# Patient Record
Sex: Female | Born: 1960 | Race: Black or African American | Hispanic: No | Marital: Married | Smoking: Never smoker
Health system: Southern US, Community
[De-identification: ages and names within clinical notes are randomized; demographics above are authoritative.]

## PROBLEM LIST (undated history)

## (undated) DIAGNOSIS — I10 Essential (primary) hypertension: Secondary | ICD-10-CM

## (undated) DIAGNOSIS — K5090 Crohn's disease, unspecified, without complications: Secondary | ICD-10-CM

## (undated) DIAGNOSIS — E785 Hyperlipidemia, unspecified: Secondary | ICD-10-CM

---

## 1988-11-29 HISTORY — PX: SMALL INTESTINE SURGERY: SHX150

## 2005-05-17 ENCOUNTER — Other Ambulatory Visit: Admission: RE | Admit: 2005-05-17 | Discharge: 2005-05-17 | Payer: Self-pay | Admitting: Obstetrics and Gynecology

## 2005-06-23 ENCOUNTER — Other Ambulatory Visit: Admission: RE | Admit: 2005-06-23 | Discharge: 2005-06-23 | Payer: Self-pay | Admitting: Obstetrics and Gynecology

## 2005-06-28 ENCOUNTER — Ambulatory Visit: Payer: Self-pay | Admitting: Family Medicine

## 2007-11-24 ENCOUNTER — Ambulatory Visit: Payer: Self-pay | Admitting: Family Medicine

## 2009-11-11 ENCOUNTER — Ambulatory Visit: Payer: Self-pay | Admitting: Family Medicine

## 2010-10-09 ENCOUNTER — Ambulatory Visit: Payer: Self-pay | Admitting: Gastroenterology

## 2010-10-13 LAB — PATHOLOGY REPORT

## 2010-11-12 ENCOUNTER — Ambulatory Visit: Payer: Self-pay | Admitting: Unknown Physician Specialty

## 2011-09-03 ENCOUNTER — Ambulatory Visit: Payer: Self-pay | Admitting: Unknown Physician Specialty

## 2011-12-27 ENCOUNTER — Ambulatory Visit: Payer: Self-pay | Admitting: Unknown Physician Specialty

## 2013-05-17 ENCOUNTER — Ambulatory Visit: Payer: Self-pay | Admitting: Physician Assistant

## 2013-06-06 ENCOUNTER — Ambulatory Visit: Payer: Self-pay | Admitting: Physician Assistant

## 2014-07-12 ENCOUNTER — Ambulatory Visit: Payer: Self-pay | Admitting: Physician Assistant

## 2015-09-02 ENCOUNTER — Other Ambulatory Visit: Payer: Self-pay | Admitting: Physician Assistant

## 2015-09-02 DIAGNOSIS — Z1231 Encounter for screening mammogram for malignant neoplasm of breast: Secondary | ICD-10-CM

## 2015-09-09 ENCOUNTER — Ambulatory Visit: Payer: Self-pay

## 2015-09-15 ENCOUNTER — Ambulatory Visit
Admission: RE | Admit: 2015-09-15 | Discharge: 2015-09-15 | Disposition: A | Discharge status: Home / Self Care | Payer: Managed Care, Other (non HMO) | Source: Ambulatory Visit | Attending: Physician Assistant | Admitting: Physician Assistant

## 2015-09-15 DIAGNOSIS — Z1231 Encounter for screening mammogram for malignant neoplasm of breast: Secondary | ICD-10-CM | POA: Diagnosis present

## 2017-06-13 ENCOUNTER — Other Ambulatory Visit: Payer: Self-pay | Admitting: Internal Medicine

## 2017-06-13 DIAGNOSIS — Z1231 Encounter for screening mammogram for malignant neoplasm of breast: Secondary | ICD-10-CM

## 2017-07-08 ENCOUNTER — Ambulatory Visit
Admission: RE | Admit: 2017-07-08 | Discharge: 2017-07-08 | Disposition: A | Discharge status: Home / Self Care | Payer: Managed Care, Other (non HMO) | Source: Ambulatory Visit | Attending: Internal Medicine | Admitting: Internal Medicine

## 2017-07-08 DIAGNOSIS — Z1231 Encounter for screening mammogram for malignant neoplasm of breast: Secondary | ICD-10-CM | POA: Diagnosis present

## 2018-06-19 ENCOUNTER — Other Ambulatory Visit: Payer: Self-pay | Admitting: Physician Assistant

## 2018-06-19 DIAGNOSIS — Z1231 Encounter for screening mammogram for malignant neoplasm of breast: Secondary | ICD-10-CM

## 2020-05-12 ENCOUNTER — Ambulatory Visit
Admission: RE | Admit: 2020-05-12 | Discharge: 2020-05-12 | Disposition: A | Discharge status: Home / Self Care | Payer: Managed Care, Other (non HMO) | Source: Ambulatory Visit | Attending: Physician Assistant | Admitting: Physician Assistant

## 2020-05-12 ENCOUNTER — Other Ambulatory Visit: Payer: Self-pay | Admitting: Physician Assistant

## 2020-05-12 DIAGNOSIS — Z1231 Encounter for screening mammogram for malignant neoplasm of breast: Secondary | ICD-10-CM

## 2021-06-26 ENCOUNTER — Other Ambulatory Visit: Payer: Self-pay | Admitting: Physician Assistant

## 2021-06-26 DIAGNOSIS — Z1231 Encounter for screening mammogram for malignant neoplasm of breast: Secondary | ICD-10-CM

## 2021-07-03 ENCOUNTER — Other Ambulatory Visit: Payer: Self-pay

## 2021-07-03 ENCOUNTER — Ambulatory Visit
Admission: RE | Admit: 2021-07-03 | Discharge: 2021-07-03 | Disposition: A | Discharge status: Home / Self Care | Payer: 59 | Source: Ambulatory Visit | Attending: Physician Assistant | Admitting: Physician Assistant

## 2021-07-03 DIAGNOSIS — Z1231 Encounter for screening mammogram for malignant neoplasm of breast: Secondary | ICD-10-CM | POA: Diagnosis not present

## 2022-05-14 IMAGING — MG MM DIGITAL SCREENING BILAT W/ TOMO AND CAD
8 series · 8 of 24 positions shown · non-contrast
Comparison: Previous exam(s).

CLINICAL DATA: Screening.

EXAM:
DIGITAL SCREENING BILATERAL MAMMOGRAM WITH TOMOSYNTHESIS AND CAD
TECHNIQUE: Bilateral screening digital craniocaudal and mediolateral oblique
mammograms were obtained. Bilateral screening digital breast
tomosynthesis was performed. The images were evaluated with
computer-aided detection.

[L CC synth-2D]
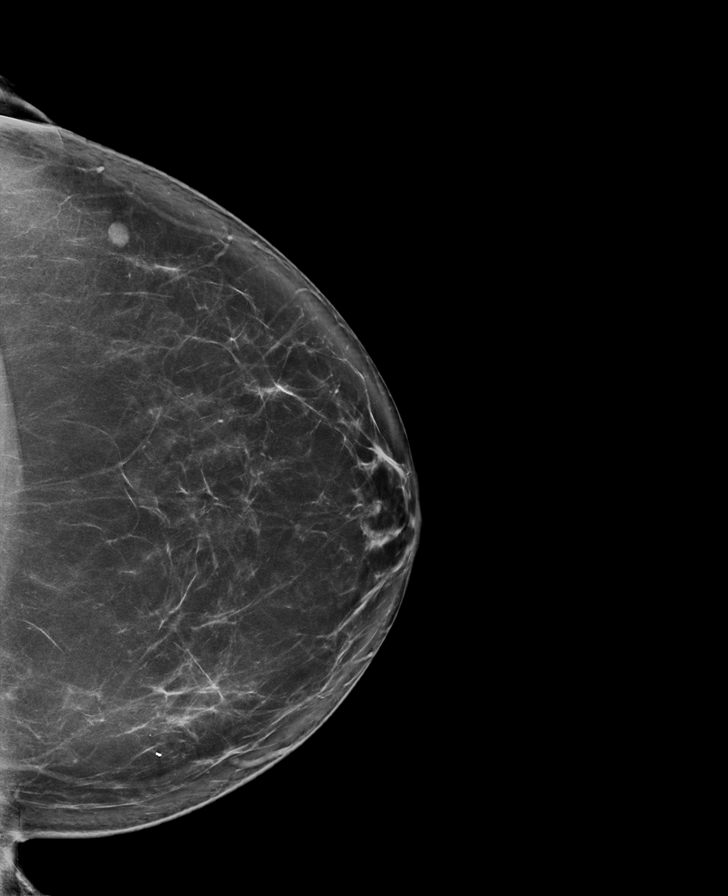

[R MLO synth-2D]
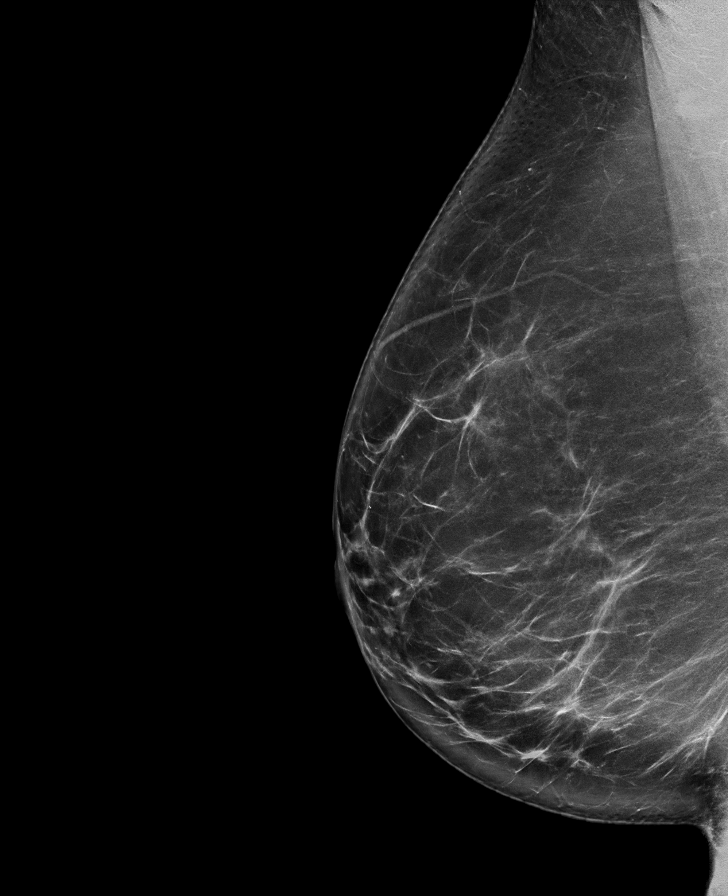

[R CC synth-2D]
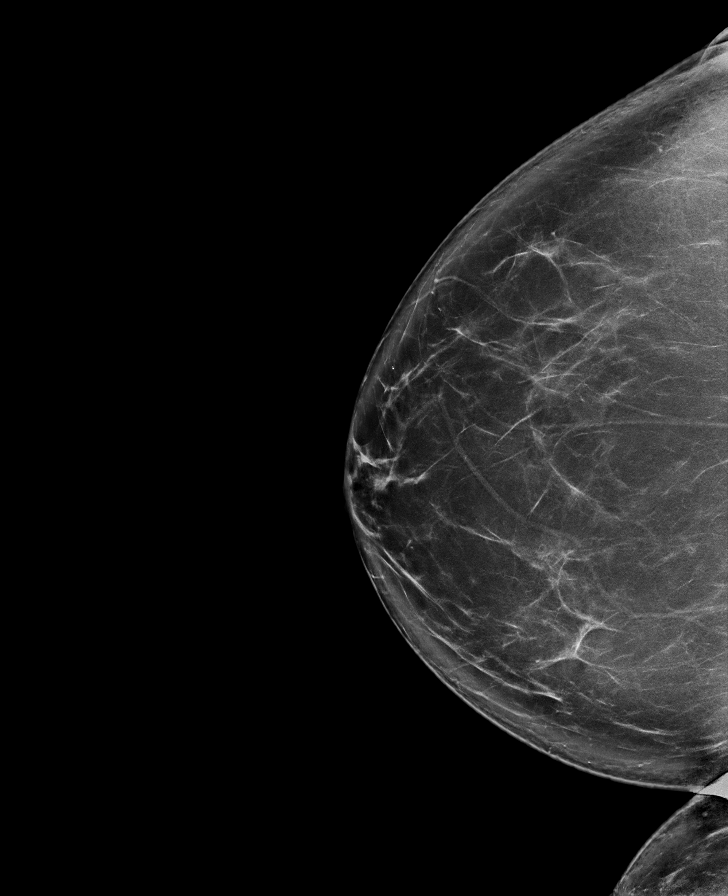

[L MLO synth-2D]
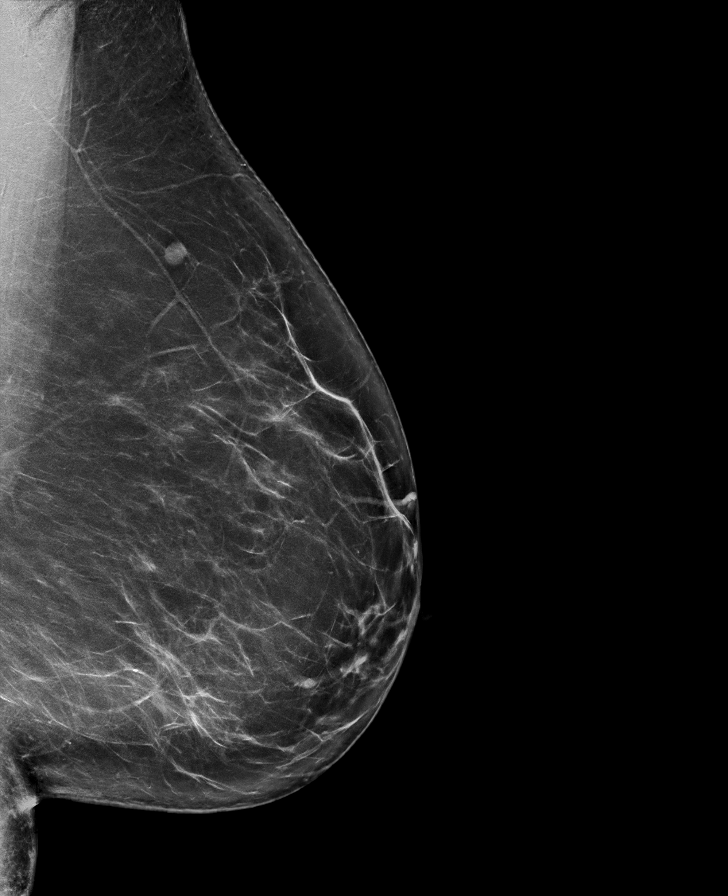

[R CC tomo · tomo slice 48/95.0]
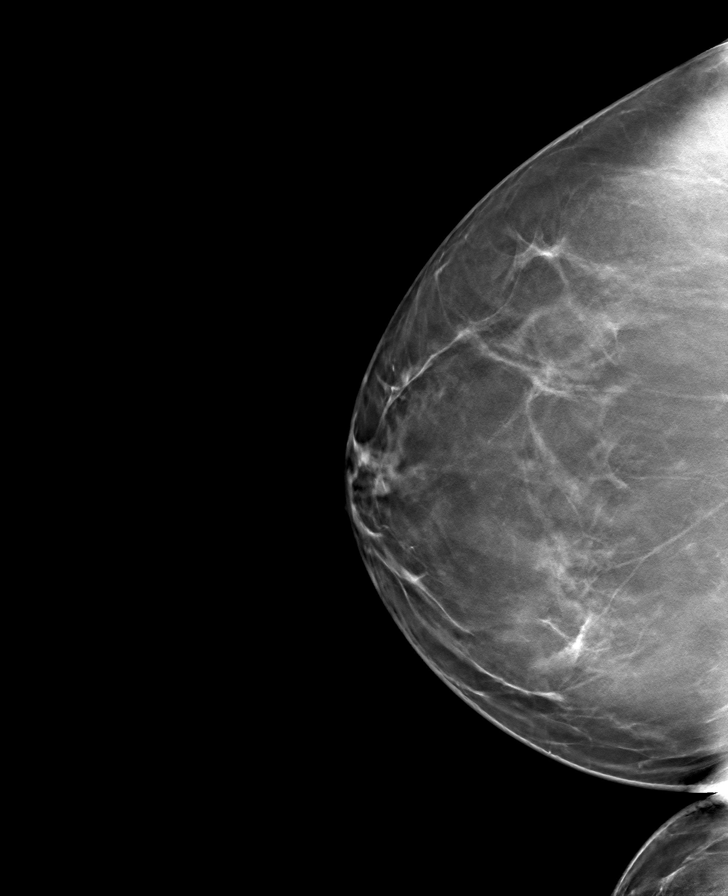

[L MLO tomo · tomo slice 45/88.0]
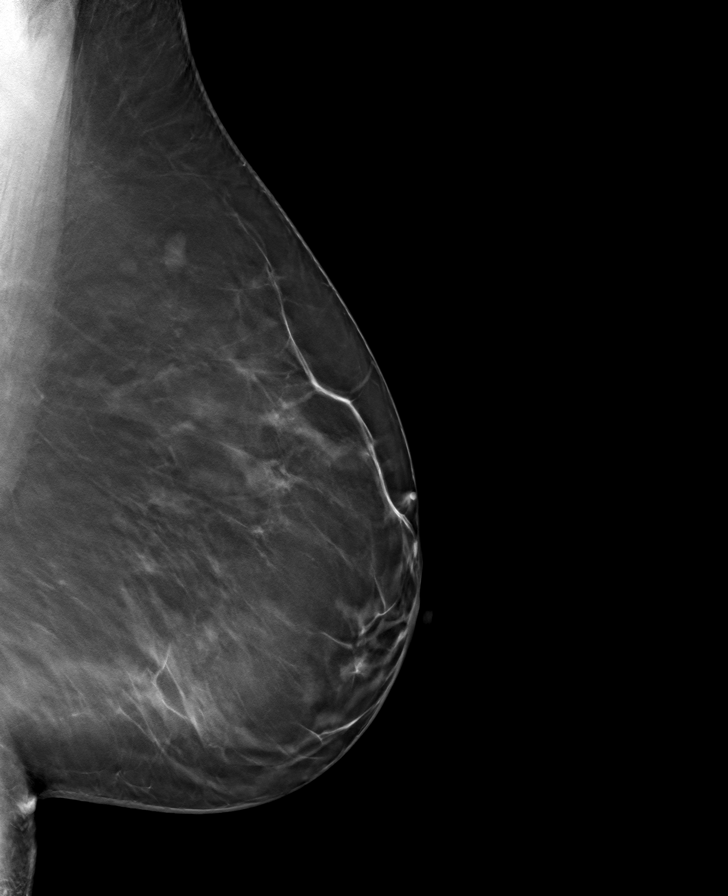

[L CC tomo · tomo slice 46/91.0]
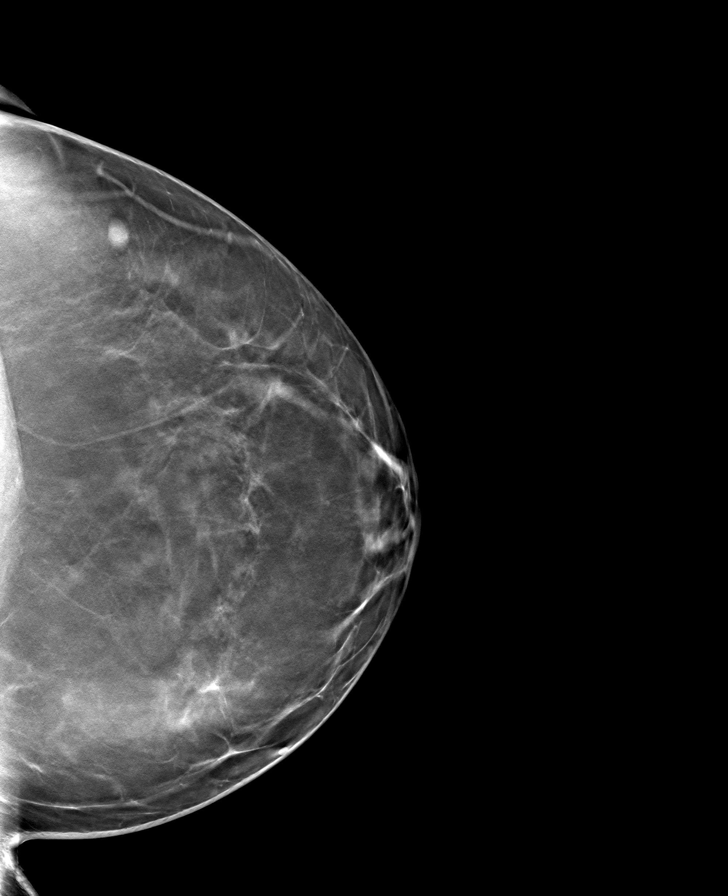

[R MLO tomo · tomo slice 46/91.0]
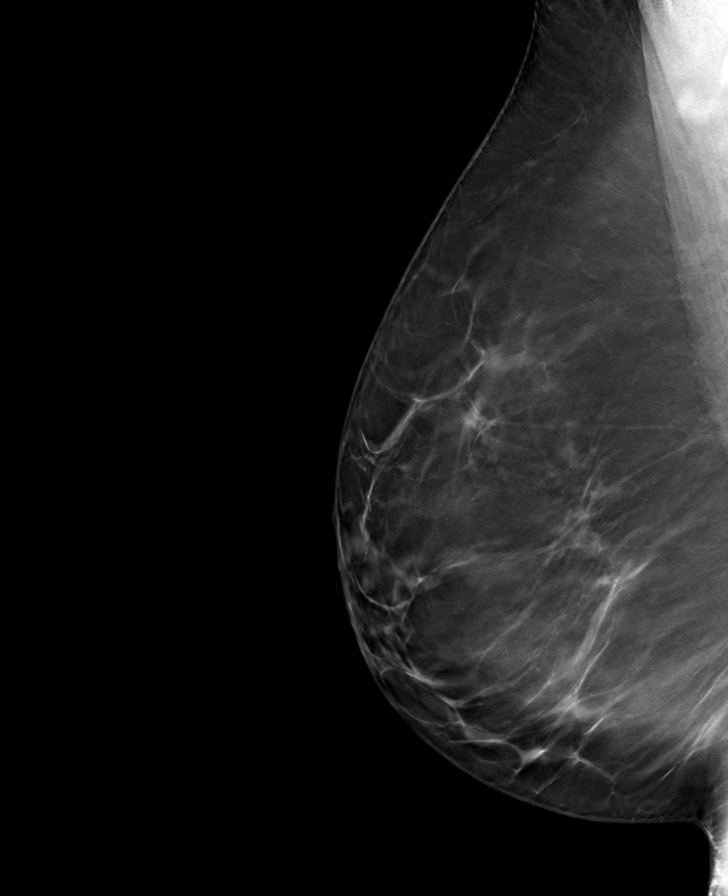

[8 of 24 positions shown; findings below may reference images not displayed]

ACR Breast Density Category b: There are scattered areas of
fibroglandular density.
FINDINGS: There are no findings suspicious for malignancy.
IMPRESSION: No mammographic evidence of malignancy. A result letter of this
screening mammogram will be mailed directly to the patient.

RECOMMENDATION:
Screening mammogram in one year. (Code:51-O-LD2)

BI-RADS CATEGORY  1: Negative.

## 2022-06-21 ENCOUNTER — Other Ambulatory Visit: Payer: Self-pay | Admitting: Physician Assistant

## 2022-06-21 DIAGNOSIS — Z1231 Encounter for screening mammogram for malignant neoplasm of breast: Secondary | ICD-10-CM

## 2022-08-03 ENCOUNTER — Ambulatory Visit
Admission: RE | Admit: 2022-08-03 | Discharge: 2022-08-03 | Disposition: A | Discharge status: Home / Self Care | Payer: 59 | Source: Ambulatory Visit | Attending: Physician Assistant | Admitting: Physician Assistant

## 2022-08-03 DIAGNOSIS — Z1231 Encounter for screening mammogram for malignant neoplasm of breast: Secondary | ICD-10-CM | POA: Insufficient documentation

## 2023-03-18 ENCOUNTER — Encounter: Payer: Self-pay | Admitting: *Deleted

## 2023-03-24 ENCOUNTER — Encounter: Payer: Self-pay | Admitting: *Deleted

## 2023-03-25 ENCOUNTER — Encounter
Admission: RE | Disposition: A | Discharge status: Home / Self Care | Payer: Self-pay | Source: Ambulatory Visit | Attending: Gastroenterology

## 2023-03-25 ENCOUNTER — Ambulatory Visit: Payer: BC Managed Care – PPO | Admitting: Anesthesiology

## 2023-03-25 ENCOUNTER — Ambulatory Visit
Admission: RE | Admit: 2023-03-25 | Discharge: 2023-03-25 | Disposition: A | Discharge status: Home / Self Care | Payer: BC Managed Care – PPO | Source: Ambulatory Visit | Attending: Gastroenterology | Admitting: Gastroenterology

## 2023-03-25 ENCOUNTER — Encounter: Payer: Self-pay | Admitting: *Deleted

## 2023-03-25 DIAGNOSIS — Z980 Intestinal bypass and anastomosis status: Secondary | ICD-10-CM | POA: Diagnosis not present

## 2023-03-25 DIAGNOSIS — I10 Essential (primary) hypertension: Secondary | ICD-10-CM | POA: Diagnosis not present

## 2023-03-25 DIAGNOSIS — K633 Ulcer of intestine: Secondary | ICD-10-CM | POA: Diagnosis not present

## 2023-03-25 DIAGNOSIS — K5080 Crohn's disease of both small and large intestine without complications: Secondary | ICD-10-CM | POA: Insufficient documentation

## 2023-03-25 DIAGNOSIS — K640 First degree hemorrhoids: Secondary | ICD-10-CM | POA: Diagnosis not present

## 2023-03-25 DIAGNOSIS — D175 Benign lipomatous neoplasm of intra-abdominal organs: Secondary | ICD-10-CM | POA: Insufficient documentation

## 2023-03-25 DIAGNOSIS — Z79899 Other long term (current) drug therapy: Secondary | ICD-10-CM | POA: Diagnosis not present

## 2023-03-25 HISTORY — DX: Hyperlipidemia, unspecified: E78.5

## 2023-03-25 HISTORY — DX: Essential (primary) hypertension: I10

## 2023-03-25 HISTORY — PX: COLONOSCOPY WITH PROPOFOL: SHX5780

## 2023-03-25 HISTORY — DX: Crohn's disease, unspecified, without complications: K50.90

## 2023-03-25 SURGERY — COLONOSCOPY WITH PROPOFOL
Anesthesia: General

## 2023-03-25 MED ORDER — PROPOFOL 10 MG/ML IV BOLUS
INTRAVENOUS | Status: DC | PRN
  Administered 2023-03-25: 80 mg via INTRAVENOUS

## 2023-03-25 MED ORDER — SODIUM CHLORIDE 0.9 % IV SOLN
INTRAVENOUS | Status: DC
  Administered 2023-03-25: 1000 mL via INTRAVENOUS

## 2023-03-25 MED ORDER — LIDOCAINE HCL (CARDIAC) PF 100 MG/5ML IV SOSY
PREFILLED_SYRINGE | INTRAVENOUS | Status: DC | PRN
  Administered 2023-03-25: 50 mg via INTRAVENOUS

## 2023-03-25 MED ORDER — PROPOFOL 500 MG/50ML IV EMUL
INTRAVENOUS | Status: DC | PRN
  Administered 2023-03-25: 75 ug/kg/min via INTRAVENOUS

## 2023-03-25 NOTE — Transfer of Care (Signed)
Immediate Anesthesia Transfer of Care Note  Patient: Terry Wade  Procedure(s) Performed: COLONOSCOPY WITH PROPOFOL  Patient Location: Endoscopy Unit  Anesthesia Type:General  Level of Consciousness: drowsy and patient cooperative  Airway & Oxygen Therapy: Patient Spontanous Breathing and Patient connected to face mask oxygen  Post-op Assessment: Report given to RN and Post -op Vital signs reviewed and stable  Post vital signs: Reviewed and stable  Last Vitals:  Vitals Value Taken Time  BP 113/78 03/25/23 1450  Temp 35.1 C 03/25/23 1450  Pulse 87 03/25/23 1450  Resp 14 03/25/23 1450  SpO2 100 % 03/25/23 1450    Last Pain:  Vitals:   03/25/23 1450  TempSrc: Temporal  PainSc: Asleep         Complications: No notable events documented.

## 2023-03-25 NOTE — Interval H&P Note (Signed)
History and Physical Interval Note:  03/25/2023 2:26 PM  Terry Wade  has presented today for surgery, with the diagnosis of Chrohns Disease.  The various methods of treatment have been discussed with the patient and family. After consideration of risks, benefits and other options for treatment, the patient has consented to  Procedure(s): COLONOSCOPY WITH PROPOFOL (N/A) as a surgical intervention.  The patient's history has been reviewed, patient examined, no change in status, stable for surgery.  I have reviewed the patient's chart and labs.  Questions were answered to the patient's satisfaction.     Regis Bill  Ok to proceed with colonoscopy

## 2023-03-25 NOTE — Anesthesia Preprocedure Evaluation (Addendum)
Anesthesia Evaluation  Patient identified by MRN, date of birth, ID band Patient awake    Reviewed: Allergy & Precautions, NPO status , Patient's Chart, lab work & pertinent test results  History of Anesthesia Complications Negative for: history of anesthetic complications  Airway Mallampati: II  TM Distance: >3 FB Neck ROM: full    Dental no notable dental hx.    Pulmonary neg pulmonary ROS   Pulmonary exam normal        Cardiovascular hypertension, On Medications Normal cardiovascular exam     Neuro/Psych negative neurological ROS  negative psych ROS   GI/Hepatic negative GI ROS, Neg liver ROS,,,  Endo/Other  negative endocrine ROS    Renal/GU negative Renal ROS  negative genitourinary   Musculoskeletal   Abdominal   Peds  Hematology negative hematology ROS (+)   Anesthesia Other Findings Past Medical History: No date: Crohn's disease (HCC) No date: Hyperlipidemia No date: Hypertension  Past Surgical History: 1990: SMALL INTESTINE SURGERY     Reproductive/Obstetrics negative OB ROS                             Anesthesia Physical Anesthesia Plan  ASA: 2  Anesthesia Plan: General   Post-op Pain Management: Minimal or no pain anticipated   Induction: Intravenous  PONV Risk Score and Plan: Propofol infusion and TIVA  Airway Management Planned: Natural Airway and Nasal Cannula  Additional Equipment:   Intra-op Plan:   Post-operative Plan:   Informed Consent: I have reviewed the patients History and Physical, chart, labs and discussed the procedure including the risks, benefits and alternatives for the proposed anesthesia with the patient or authorized representative who has indicated his/her understanding and acceptance.     Dental Advisory Given  Plan Discussed with: Anesthesiologist, CRNA and Surgeon  Anesthesia Plan Comments: (Patient consented for risks of  anesthesia including but not limited to:  - adverse reactions to medications - risk of airway placement if required - damage to eyes, teeth, lips or other oral mucosa - nerve damage due to positioning  - sore throat or hoarseness - Damage to heart, brain, nerves, lungs, other parts of body or loss of life  Patient voiced understanding.)       Anesthesia Quick Evaluation

## 2023-03-25 NOTE — Op Note (Addendum)
[facility] Patient Name: Terry Wade Procedure Date: 03/25/2023 1:49 PM MRN: [id number] Account #: [id number] Date of Birth: June 17, 1961 Admit Type: Outpatient Age: 62 Room: [facility] ENDO ROOM 3 Gender: Female Note Status: Finalized Instrument Name: Prentice Docker [id number] Procedure:             Colonoscopy Indications:           Crohn's disease of the small bowel and colon Providers:             Eather Colas MD, MD Referring MD:          Marilynne Halsted, MD (Referring MD) Medicines:             Monitored Anesthesia Care Complications:         No immediate complications. Procedure:             Pre-Anesthesia Assessment:                        - Prior to the procedure, a History and Physical was                         performed, and patient medications and allergies were                         reviewed. The patient is competent. The risks and                         benefits of the procedure and the sedation options and                         risks were discussed with the patient. All questions                         were answered and informed consent was obtained.                         Patient identification and proposed procedure were                         verified by the physician, the nurse, the                         anesthesiologist, the anesthetist and the technician                         in the endoscopy suite. Mental Status Examination:                         alert and oriented. Airway Examination: normal                         oropharyngeal airway and neck mobility. Respiratory                         Examination: clear to auscultation. CV Examination:                         normal. Prophylactic Antibiotics: The patient does not  require prophylactic antibiotics. Prior                         Anticoagulants: The patient has taken no anticoagulant                         or antiplatelet agents. ASA  Grade Assessment: II - A                         patient with mild systemic disease. After reviewing                         the risks and benefits, the patient was deemed in                         satisfactory condition to undergo the procedure. The                         anesthesia plan was to use monitored anesthesia care                         (MAC). Immediately prior to administration of                         medications, the patient was re-assessed for adequacy                         to receive sedatives. The heart rate, respiratory                         rate, oxygen saturations, blood pressure, adequacy of                         pulmonary ventilation, and response to care were                         monitored throughout the procedure. The physical                         status of the patient was re-assessed after the                         procedure.                        After obtaining informed consent, the colonoscope was                         passed under direct vision. Throughout the procedure,                         the patient's blood pressure, pulse, and oxygen                         saturations were monitored continuously. The                         Colonoscope was introduced through the anus and  advanced to the the ileocolonic anastomosis. The                         colonoscopy was performed without difficulty. The                         patient tolerated the procedure well. The quality of                         the bowel preparation was good. Ileocolonic                         anastomosis, neo-terminal ileum, and rectum were                         photographed. Findings:      The perianal and digital rectal examinations were normal.      The neo-terminal ileum appeared normal.      A single (solitary) five mm ulcer was found at the anastomosis. No       bleeding was present. No stigmata of recent bleeding were seen. This is        likely ischemic due to anastomosis.      There was a small lipoma, 10 mm in diameter, in the proximal transverse       colon.      There was a small lipoma, 10 mm in diameter, in the transverse colon.      Internal hemorrhoids were found during retroflexion. The hemorrhoids       were Grade I (internal hemorrhoids that do not prolapse).      The exam was otherwise without abnormality on direct and retroflexion       views. Impression:            - The examined portion of the ileum was normal.                        - A single (solitary) ulcer at the colonic anastomosis.                        - Small lipoma in the proximal transverse colon.                        - Small lipoma in the transverse colon.                        - Internal hemorrhoids.                        - The examination was otherwise normal on direct and                         retroflexion views.                        - No specimens collected. Recommendation:        - Discharge patient to home.                        - Resume previous diet.                        -  Continue present medications.                        - Repeat colonoscopy in 10 years as it seems her                         crohn's disease was limited to small bowel.                        - Return to referring physician as previously                         scheduled. Procedure Code(s):     --- Professional ---                        [phone], Colonoscopy, flexible; diagnostic, including                         collection of specimen(s) by brushing or washing, when                         performed (separate procedure) Diagnosis Code(s):     --- Professional ---                        K64.0, First degree hemorrhoids                        K63.3, Ulcer of intestine                        D17.5, Benign lipomatous neoplasm of intra-abdominal                         organs                        K50.80, Crohn's disease of both small and large                          intestine without complications CPT copyright 2022 [employer]. All rights reserved. The codes documented in this report are preliminary and upon coder review may  be revised to meet current compliance requirements. Eather Colas MD, MD 03/25/2023 2:53:24 PM Number of Addenda: 0 Note Initiated On: 03/25/2023 1:49 PM Scope Withdrawal Time: 0 hours 7 minutes 27 seconds  Total Procedure Duration: 0 hours 12 minutes 29 seconds  Estimated Blood Loss:  Estimated blood loss: none.      [facility]

## 2023-03-25 NOTE — H&P (Signed)
Outpatient short stay form Pre-procedure 03/25/2023  Regis Bill, MD  Primary Physician: Patrice Paradise, MD  Reason for visit:  History of crohns  History of present illness:    62 y/o lady with history of hypertension and crohns disease here for colonoscopy. Last colonoscopy that I can see was in 2011 that was unremarkable. History of small bowel resection due to crohns. Not on any crohns medications. No blood thinners. No family history of GI malignancies.    Current Facility-Administered Medications:    0.9 %  sodium chloride infusion, , Intravenous, Continuous, Jujhar Everett, Rossie Muskrat, MD, Last Rate: 20 mL/hr at 03/25/23 1335, 1,000 mL at 03/25/23 1335  Medications Prior to Admission  Medication Sig Dispense Refill Last Dose   amLODipine (NORVASC) 5 MG tablet Take 5 mg by mouth daily.   03/25/2023   benazepril (LOTENSIN) 40 MG tablet Take 40 mg by mouth daily.   03/25/2023   Beta Carotene (VITAMIN A) 25000 UNIT capsule Take 25,000 Units by mouth daily.   03/25/2023   Cholecalciferol 25 MCG (1000 UT) capsule Take 1,000 Units by mouth daily.      hydrochlorothiazide (MICROZIDE) 12.5 MG capsule Take 12.5 mg by mouth daily.   03/25/2023   Vitamin D, Ergocalciferol, (DRISDOL) 1.25 MG (50000 UNIT) CAPS capsule Take by mouth every 7 (seven) days.   03/25/2023   vitamin E 180 MG (400 UNITS) capsule Take 400 Units by mouth daily.   03/25/2023     Allergies  Allergen Reactions   Sulfa Antibiotics Rash     Past Medical History:  Diagnosis Date   Crohn's disease (HCC)    Hyperlipidemia    Hypertension     Review of systems:  Otherwise negative.    Physical Exam  Gen: Alert, oriented. Appears stated age.  HEENT: PERRLA. Lungs: No respiratory distress CV: RRR Abd: soft, benign, no masses Ext: No edema    Planned procedures: Proceed with colonoscopy. The patient understands the nature of the planned procedure, indications, risks, alternatives and potential complications  including but not limited to bleeding, infection, perforation, damage to internal organs and possible oversedation/side effects from anesthesia. The patient agrees and gives consent to proceed.  Please refer to procedure notes for findings, recommendations and patient disposition/instructions.     Regis Bill, MD [facility]enter[facility]

## 2023-03-25 NOTE — Anesthesia Postprocedure Evaluation (Signed)
Anesthesia Post Note  Patient: Terry Wade  Procedure(s) Performed: COLONOSCOPY WITH PROPOFOL  Patient location during evaluation: Endoscopy Anesthesia Type: General Level of consciousness: awake and alert Pain management: pain level controlled Vital Signs Assessment: post-procedure vital signs reviewed and stable Respiratory status: spontaneous breathing, nonlabored ventilation, respiratory function stable and patient connected to nasal cannula oxygen Cardiovascular status: blood pressure returned to baseline and stable Postop Assessment: no apparent nausea or vomiting Anesthetic complications: no   No notable events documented.   Last Vitals:  Vitals:   03/25/23 1500 03/25/23 1512  BP: 126/88 (!) 143/95  Pulse: 77 70  Resp: 13 11  Temp:    SpO2: 100% 100%    Last Pain:  Vitals:   03/25/23 1450  TempSrc: Temporal  PainSc: Asleep                 Louie Boston

## 2023-03-28 ENCOUNTER — Encounter: Payer: Self-pay | Admitting: Gastroenterology
